# Patient Record
Sex: Female | Born: 1980 | Race: Black or African American | Hispanic: No | Marital: Single | Smoking: Never smoker
Health system: Southern US, Community
[De-identification: ages and names within clinical notes are randomized; demographics above are authoritative.]

## PROBLEM LIST (undated history)

## (undated) DIAGNOSIS — D219 Benign neoplasm of connective and other soft tissue, unspecified: Secondary | ICD-10-CM

## (undated) HISTORY — PX: BONE GRAFT: SHX377

## (undated) HISTORY — DX: Benign neoplasm of connective and other soft tissue, unspecified: D21.9

---

## 2021-04-28 ENCOUNTER — Emergency Department (HOSPITAL_COMMUNITY): Payer: Self-pay

## 2021-04-28 ENCOUNTER — Emergency Department (HOSPITAL_COMMUNITY)
Admission: EM | Admit: 2021-04-28 | Discharge: 2021-04-28 | Disposition: A | Discharge status: Home / Self Care | Payer: Self-pay | Attending: Emergency Medicine | Admitting: Emergency Medicine

## 2021-04-28 ENCOUNTER — Encounter (HOSPITAL_COMMUNITY): Payer: Self-pay | Admitting: Emergency Medicine

## 2021-04-28 ENCOUNTER — Other Ambulatory Visit: Payer: Self-pay

## 2021-04-28 DIAGNOSIS — R200 Anesthesia of skin: Secondary | ICD-10-CM | POA: Insufficient documentation

## 2021-04-28 DIAGNOSIS — M25512 Pain in left shoulder: Secondary | ICD-10-CM | POA: Insufficient documentation

## 2021-04-28 DIAGNOSIS — R110 Nausea: Secondary | ICD-10-CM | POA: Insufficient documentation

## 2021-04-28 DIAGNOSIS — Z5321 Procedure and treatment not carried out due to patient leaving prior to being seen by health care provider: Secondary | ICD-10-CM | POA: Insufficient documentation

## 2021-04-28 DIAGNOSIS — R1013 Epigastric pain: Secondary | ICD-10-CM | POA: Insufficient documentation

## 2021-04-28 LAB — CBC WITH DIFFERENTIAL/PLATELET
Abs Immature Granulocytes: 0.01 10*3/uL (ref 0.00–0.07)
Basophils Absolute: 0 10*3/uL (ref 0.0–0.1)
Basophils Relative: 0 %
Eosinophils Absolute: 0.1 10*3/uL (ref 0.0–0.5)
Eosinophils Relative: 1 %
HCT: 37.1 % (ref 36.0–46.0)
Hemoglobin: 12.1 g/dL (ref 12.0–15.0)
Immature Granulocytes: 0 %
Lymphocytes Relative: 45 %
Lymphs Abs: 3.1 10*3/uL (ref 0.7–4.0)
MCH: 28.7 pg (ref 26.0–34.0)
MCHC: 32.6 g/dL (ref 30.0–36.0)
MCV: 87.9 fL (ref 80.0–100.0)
Monocytes Absolute: 0.5 10*3/uL (ref 0.1–1.0)
Monocytes Relative: 7 %
Neutro Abs: 3.2 10*3/uL (ref 1.7–7.7)
Neutrophils Relative %: 47 %
Platelets: 235 10*3/uL (ref 150–400)
RBC: 4.22 MIL/uL (ref 3.87–5.11)
RDW: 13 % (ref 11.5–15.5)
WBC: 6.9 10*3/uL (ref 4.0–10.5)
nRBC: 0 % (ref 0.0–0.2)

## 2021-04-28 LAB — LIPASE, BLOOD: Lipase: 34 U/L (ref 11–51)

## 2021-04-28 LAB — COMPREHENSIVE METABOLIC PANEL
ALT: 9 U/L (ref 0–44)
AST: 13 U/L — ABNORMAL LOW (ref 15–41)
Albumin: 4 g/dL (ref 3.5–5.0)
Alkaline Phosphatase: 55 U/L (ref 38–126)
Anion gap: 10 (ref 5–15)
BUN: 10 mg/dL (ref 6–20)
CO2: 20 mmol/L — ABNORMAL LOW (ref 22–32)
Calcium: 9 mg/dL (ref 8.9–10.3)
Chloride: 111 mmol/L (ref 98–111)
Creatinine, Ser: 0.89 mg/dL (ref 0.44–1.00)
GFR, Estimated: >60 mL/min (ref >60)
Glucose, Bld: 100 mg/dL — ABNORMAL HIGH (ref 70–99)
Potassium: 3.8 mmol/L (ref 3.5–5.1)
Sodium: 141 mmol/L (ref 135–145)
Total Bilirubin: 0.5 mg/dL (ref 0.3–1.2)
Total Protein: 7 g/dL (ref 6.5–8.1)

## 2021-04-28 LAB — TROPONIN I (HIGH SENSITIVITY)
Troponin I (High Sensitivity): 2 ng/L (ref <18)
Troponin I (High Sensitivity): 3 ng/L (ref <18)

## 2021-04-28 MED ORDER — ONDANSETRON 4 MG PO TBDP
4.0000 mg | ORAL_TABLET | Freq: Once | ORAL | Status: AC
  Administered 2021-04-28: 4 mg via ORAL
  Filled 2021-04-28: qty 1

## 2021-04-28 MED ORDER — METHOCARBAMOL 500 MG PO TABS
500.0000 mg | ORAL_TABLET | Freq: Once | ORAL | Status: AC
Start: 2021-04-28 — End: 2021-04-28
  Administered 2021-04-28: 500 mg via ORAL
  Filled 2021-04-28: qty 1

## 2021-04-28 NOTE — ED Notes (Signed)
Pt says she leaving.

## 2021-04-28 NOTE — ED Triage Notes (Signed)
Patient reports left shoulder pain onset Thursday this week , denies injury , she adds " heartburn" yesterday , no emesis /respirations unlabored .

## 2021-04-28 NOTE — ED Provider Triage Note (Signed)
Emergency Medicine Provider Triage Evaluation Note  Michelle Smith , a 41 y.o. female  was evaluated in triage.  Pt complains of left shoulder pain and epigastric pain/heartburn.  Patient states she has had left shoulder pain for the past several days ever since she had about a 5-hour car ride from [place].  Today her pain worsened significantly.  She now has numbness rating into her biceps.  No weakness of her hand.  No neck pain.  No history of similar.  No trauma or injury.  Patient states today she developed upper abdominal burning, that she calls heartburn.  Associated nausea, no vomiting.  No history of similar.  She did have looser earlier, which he thought triggered it, but pain continues to worsen.  Makes it hard for her to take a deep breath in.  Review of Systems  Positive: L shoulder pain, epigastric burning Negative: Neck pain  Physical Exam  BP (!) 151/93 (BP Location: Right Arm)    Pulse 77    Temp 98.2 F (36.8 C) (Oral)    Resp 18    SpO2 100%  Gen:   Awake,  patient appears uncomfortable due to pain Resp:  Normal effort  MSK:   Moves extremities without difficulty.  Tenderness palpation of the left trapezius muscle.  No pain over midline C-spine.  No step-offs or deformities.  Grip strength equal bilaterally.  Radial pulses 2+ bilaterally. Other:  Tenderness palpation of epigastric abdomen.  Medical Decision Making  Medically screening exam initiated at 3:10 AM.  Appropriate orders placed.  Michelle Smith was informed that the remainder of the evaluation will be completed by another provider, this initial triage assessment does not replace that evaluation, and the importance of remaining in the ED until their evaluation is complete.  Labs, ekg, cxr, meds   Michelle Apley, PA-C 04/28/21 [id number]

## 2021-12-07 ENCOUNTER — Emergency Department
Admission: EM | Admit: 2021-12-07 | Discharge: 2021-12-07 | Disposition: A | Discharge status: Home / Self Care | Payer: BLUE CROSS/BLUE SHIELD | Attending: Emergency Medicine | Admitting: Emergency Medicine

## 2021-12-07 ENCOUNTER — Other Ambulatory Visit: Payer: Self-pay | Admitting: Family Medicine

## 2021-12-07 ENCOUNTER — Emergency Department: Payer: BLUE CROSS/BLUE SHIELD

## 2021-12-07 DIAGNOSIS — R102 Pelvic and perineal pain: Secondary | ICD-10-CM

## 2021-12-07 DIAGNOSIS — D250 Submucous leiomyoma of uterus: Secondary | ICD-10-CM | POA: Insufficient documentation

## 2021-12-07 DIAGNOSIS — D251 Intramural leiomyoma of uterus: Secondary | ICD-10-CM

## 2021-12-07 LAB — COMPREHENSIVE METABOLIC PANEL
ALT: 5 U/L (ref 0–55)
AST (SGOT): 12 U/L (ref 5–41)
Albumin/Globulin Ratio: 1.3 (ref 0.9–2.2)
Albumin: 4.2 g/dL (ref 3.5–5.0)
Alkaline Phosphatase: 69 U/L (ref 37–117)
Anion Gap: 8 (ref 5.0–15.0)
BUN: 11 mg/dL (ref 7.0–21.0)
Bilirubin, Total: 0.4 mg/dL (ref 0.2–1.2)
CO2: 24 mEq/L (ref 17–29)
Calcium: 9.1 mg/dL (ref 8.5–10.5)
Chloride: 106 mEq/L (ref 99–111)
Creatinine: 0.9 mg/dL (ref 0.4–1.0)
Globulin: 3.2 g/dL (ref 2.0–3.6)
Glucose: 104 mg/dL — ABNORMAL HIGH (ref 70–100)
Potassium: 3.9 mEq/L (ref 3.5–5.3)
Protein, Total: 7.4 g/dL (ref 6.0–8.3)
Sodium: 138 mEq/L (ref 135–145)
eGFR: >60 mL/min/{1.73_m2} (ref >=60)

## 2021-12-07 LAB — CBC AND DIFFERENTIAL
Absolute NRBC: 0 10*3/uL (ref 0.00–0.00)
Basophils Absolute Automated: 0.02 10*3/uL (ref 0.00–0.08)
Basophils Automated: 0.3 %
Eosinophils Absolute Automated: 0.17 10*3/uL (ref 0.00–0.44)
Eosinophils Automated: 2.4 %
Hematocrit: 37.1 % (ref 34.7–43.7)
Hgb: 12.3 g/dL (ref 11.4–14.8)
Immature Granulocytes Absolute: 0.01 10*3/uL (ref 0.00–0.07)
Immature Granulocytes: 0.1 %
Instrument Absolute Neutrophil Count: 3.71 10*3/uL (ref 1.10–6.33)
Lymphocytes Absolute Automated: 2.54 10*3/uL (ref 0.42–3.22)
Lymphocytes Automated: 36.5 %
MCH: 29.1 pg (ref 25.1–33.5)
MCHC: 33.2 g/dL (ref 31.5–35.8)
MCV: 87.7 fL (ref 78.0–96.0)
MPV: 10.4 fL (ref 8.9–12.5)
Monocytes Absolute Automated: 0.51 10*3/uL (ref 0.21–0.85)
Monocytes: 7.3 %
Neutrophils Absolute: 3.71 10*3/uL (ref 1.10–6.33)
Neutrophils: 53.4 %
Nucleated RBC: 0 /100 WBC (ref 0.0–0.0)
Platelets: 249 10*3/uL (ref 142–346)
RBC: 4.23 10*6/uL (ref 3.90–5.10)
RDW: 14 % (ref 11–15)
WBC: 6.96 10*3/uL (ref 3.10–9.50)

## 2021-12-07 LAB — URINALYSIS REFLEX TO MICROSCOPIC EXAM - REFLEX TO CULTURE
Bilirubin, UA: NEGATIVE
Blood, UA: NEGATIVE
Glucose, UA: NEGATIVE
Ketones UA: NEGATIVE
Leukocyte Esterase, UA: NEGATIVE
Nitrite, UA: NEGATIVE
Protein, UR: NEGATIVE
RBC, UA: NONE SEEN /hpf (ref 0–5)
Specific Gravity UA: 1.02 (ref 1.001–1.035)
Urine pH: 6 (ref 5.0–8.0)
Urobilinogen, UA: 0.2 mg/dL (ref 0.2–2.0)
WBC, UA: NONE SEEN /hpf (ref 0–5)

## 2021-12-07 LAB — URINE HCG QUALITATIVE: Urine HCG Qualitative: NEGATIVE

## 2021-12-07 MED ORDER — ACETAMINOPHEN 500 MG PO TABS
1000.0000 mg | ORAL_TABLET | Freq: Once | ORAL | Status: AC
Start: 2021-12-07 — End: 2021-12-07
  Administered 2021-12-07: 1000 mg via ORAL
  Filled 2021-12-07: qty 2

## 2021-12-07 NOTE — ED Provider Notes (Signed)
EMERGENCY DEPARTMENT NOTE     Patient initially seen and examined at   ED PHYSICIAN ASSIGNED       Date/Time Event User Comments    12/07/21 1617 Physician Assigned [facility], [place]. Ramiro Harvest, DO assigned as Attending           ED MIDLEVEL (APP) ASSIGNED       None            HISTORY OF PRESENT ILLNESS   {Translator Used (Optional):59393}    Chief Complaint: Pelvic Pain       41 y.o. female with past medical history who presents emergency department with pelvic pain.  The patient reports acute onset of symptoms this morning.  She reports pain is mainly in the suprapubic area and left lower abdomen.  Saw her primary care doctor who referred her to the ED for pelvic ultrasound.  Patient does have a history of uterine fibroids.  She denies any fevers, vaginal bleeding, nausea, vomiting, diarrhea, pain with urination, hematuria.  Last menstrual period was 1 week ago.    Independent Historian (other than patient): No  Additional History Provided by Independent Historian:  MEDICAL HISTORY     Past Medical History:  Past Medical History:   Diagnosis Date    Fibroids        Past Surgical History:  Past Surgical History:   Procedure Laterality Date    BONE GRAFT Right     thumb       Social History:  Social History     Socioeconomic History    Marital status: Single   Tobacco Use    Smoking status: Never     Passive exposure: Never    Smokeless tobacco: Never   Vaping Use    Vaping Use: Never used   Substance and Sexual Activity    Alcohol use: Yes    Drug use: Never       Family History:  No family history on file.    Outpatient Medication:  Previous Medications    No medications on file         REVIEW OF SYSTEMS   Review of Systems See History of Present Illness  PHYSICAL EXAM     ED Triage Vitals [12/07/21 1615]   Enc Vitals Group      BP 117/84      Heart Rate 79      Resp Rate 19      Temp 99.2 F (37.3 C)      Temp Source Oral      SpO2       Weight 76.2 kg      Height 1.727 m      Head Circumference       Peak Flow        Pain Score 7      Pain Loc       Pain Edu?       Excl. in [place]?      Physical Exam   CONSTITUTIONAL:  no acute distress, nontoxic appearing  EYES:  no scleral icterus. no conjunctival erythema  HEAD:  atraumatic, normocephalic  ENT:   no facial swelling, no nasal deformity. Airway patent.   NECK:  no swelling, trachea midline  CARDIOVASCULAR:  regular rate and rhythm  PULMONARY/CHEST: lungs clear, no respiratory distress  ABDOMEN:  soft, tenderness palpation in the left lower quadrant/left adnexa.  + Suprapubic tenderness.  GU: Deferrred  MUSCULOSKELETAL: normal ROM, no deformity  SKIN:  warm and  dry, no rash  NEURO: normal speech. no facial droop. moves all extremities.  PSYCH:  appropriate mood and behavior    MEDICAL DECISION MAKING     PRIMARY PROBLEM LIST      Chronic Illness with SEVERE Exacerbation/Progression DIAGNOSIS: left adnexal pain, uterine fibroids   {Chronic Illness Impacting Care of the above problem:59030} {Explain (Optional):59078}  {Differential Diagnosis:59053}  DISCUSSION      ***    {If patient is being hospitalized is severe sepsis or septic shock suspected?:59467}      {Was management discussed with a consultant?:59037}  {Was the decision around the need for surgery discussed with consultant:59056::"N/A"}  {External Records Reviewed?:59023}    Additional Notes    {Diagnostic test considered and not performed:59031::"N/A"}  {Prescription medications considered and not given:59033::"N/A"}  {Hospitalization considered but not done:59032::"N/A"}  {Social Determinants of Health Considerations:59036::"N/A"}  {Was there decision to not resuscitate or to de-escalate care due to poor prognosis?:59057}         Vital Signs: Reviewed the patient's vital signs.   Nursing Notes: Reviewed and utilized available nursing notes.  Medical Records Reviewed: Reviewed available past medical records.  Counseling: The emergency provider has spoken with the patient and discussed today's findings, in addition to  providing specific details for the plan of care.  Questions are answered and there is agreement with the plan.      MIPS DOCUMENTATION    {ORAL ANTIBIOTICS given for otitis externa due to:59079}  {ACUTE BRONCHITIS Medical reasoning for giving this patient oral antibiotics for acute bronchitis are the following (Optional):59080}  {Medical reasoning for giving this patient oral antibiotics for acute sinusitis are the following (Optional):59081}  {HEAD TRAUMA Indications for head CT due to trauma (Optional):59082}    CARDIAC STUDIES    The following cardiac studies were independently interpreted by me the Emergency Medicine Provider.  For full cardiac study results please see chart.    {Monitor Strip Interpretation:59688}  [id number]  {Rhythm:59687}  {ST segments:59689}    {EKG interpretation:59684}  {Comparison:59859}  {Rate:59685}  {Rhythm:59687}  {ST segments:59689}  {EKG interpretation:59690}    {EKG interpretation:59684}  {Comparison:59859}  {Rate:59685}  {Rhythm:59687}  {ST segments:59689}  {EKG interpretation:59690}  EMERGENCY IMAGING STUDIES    The following imagine studies were independently interpreted by me (emergency medicine provider):    {Xray interpreted by ED provider? (Optional):59468} {SIDE (Optional):59475}  {Comparison:59859}  {RESULT:59469}  {IMPRESSION:59470}    {CT interpreted by provider? (Optional):59471}  {Comparison:59859}  {RESULT:59473}  {IMPRESSION:59474}  RADIOLOGY IMAGING STUDIES      No orders to display       EMERGENCY DEPT. MEDICATIONS      ED Medication Orders (From admission, onward)      None            LABORATORY RESULTS    Ordered and independently interpreted AVAILABLE laboratory tests.   Results       ** No results found for the last 24 hours. **              CRITICAL CARE/PROCEDURES    Procedures  ***Critical care?  DIAGNOSIS      Diagnosis:  Final diagnoses:   None       Disposition:  ED Disposition       None            Prescriptions:  Patient's Medications    No  medications on file           This note was generated by the Epic EMR system/ Dragon  speech recognition and may contain inherent errors or omissions not intended by the user. Grammatical errors, random word insertions, deletions and pronoun errors  are occasional consequences of this technology due to software limitations. Not all errors are caught or corrected. If there are questions or concerns about the content of this note or information contained within the body of this dictation they should be addressed directly with the author for clarification.    {THIS REVIEW SECTION WILL AUTODELETE ONCE NOTE IS SIGNED    END REVIEW SECTION(Optional):55325}

## 2021-12-07 NOTE — Discharge Instructions (Signed)
Take Tylenol or Motrin as needed for pain.  Follow-up with your primary care doctor and OB/GYN as discussed.  Return to the ED for continued or worsening symptoms, fevers, excessive vomiting, vaginal bleeding through more than 2 pads an hour, or other worrisome symptoms.

## 2023-09-12 IMAGING — CR DG CHEST 2V
2 series · 2 of 2 positions shown · non-contrast
Comparison: None.

CLINICAL DATA: Chest pain

EXAM:
CHEST - 2 VIEW

[chest pa]
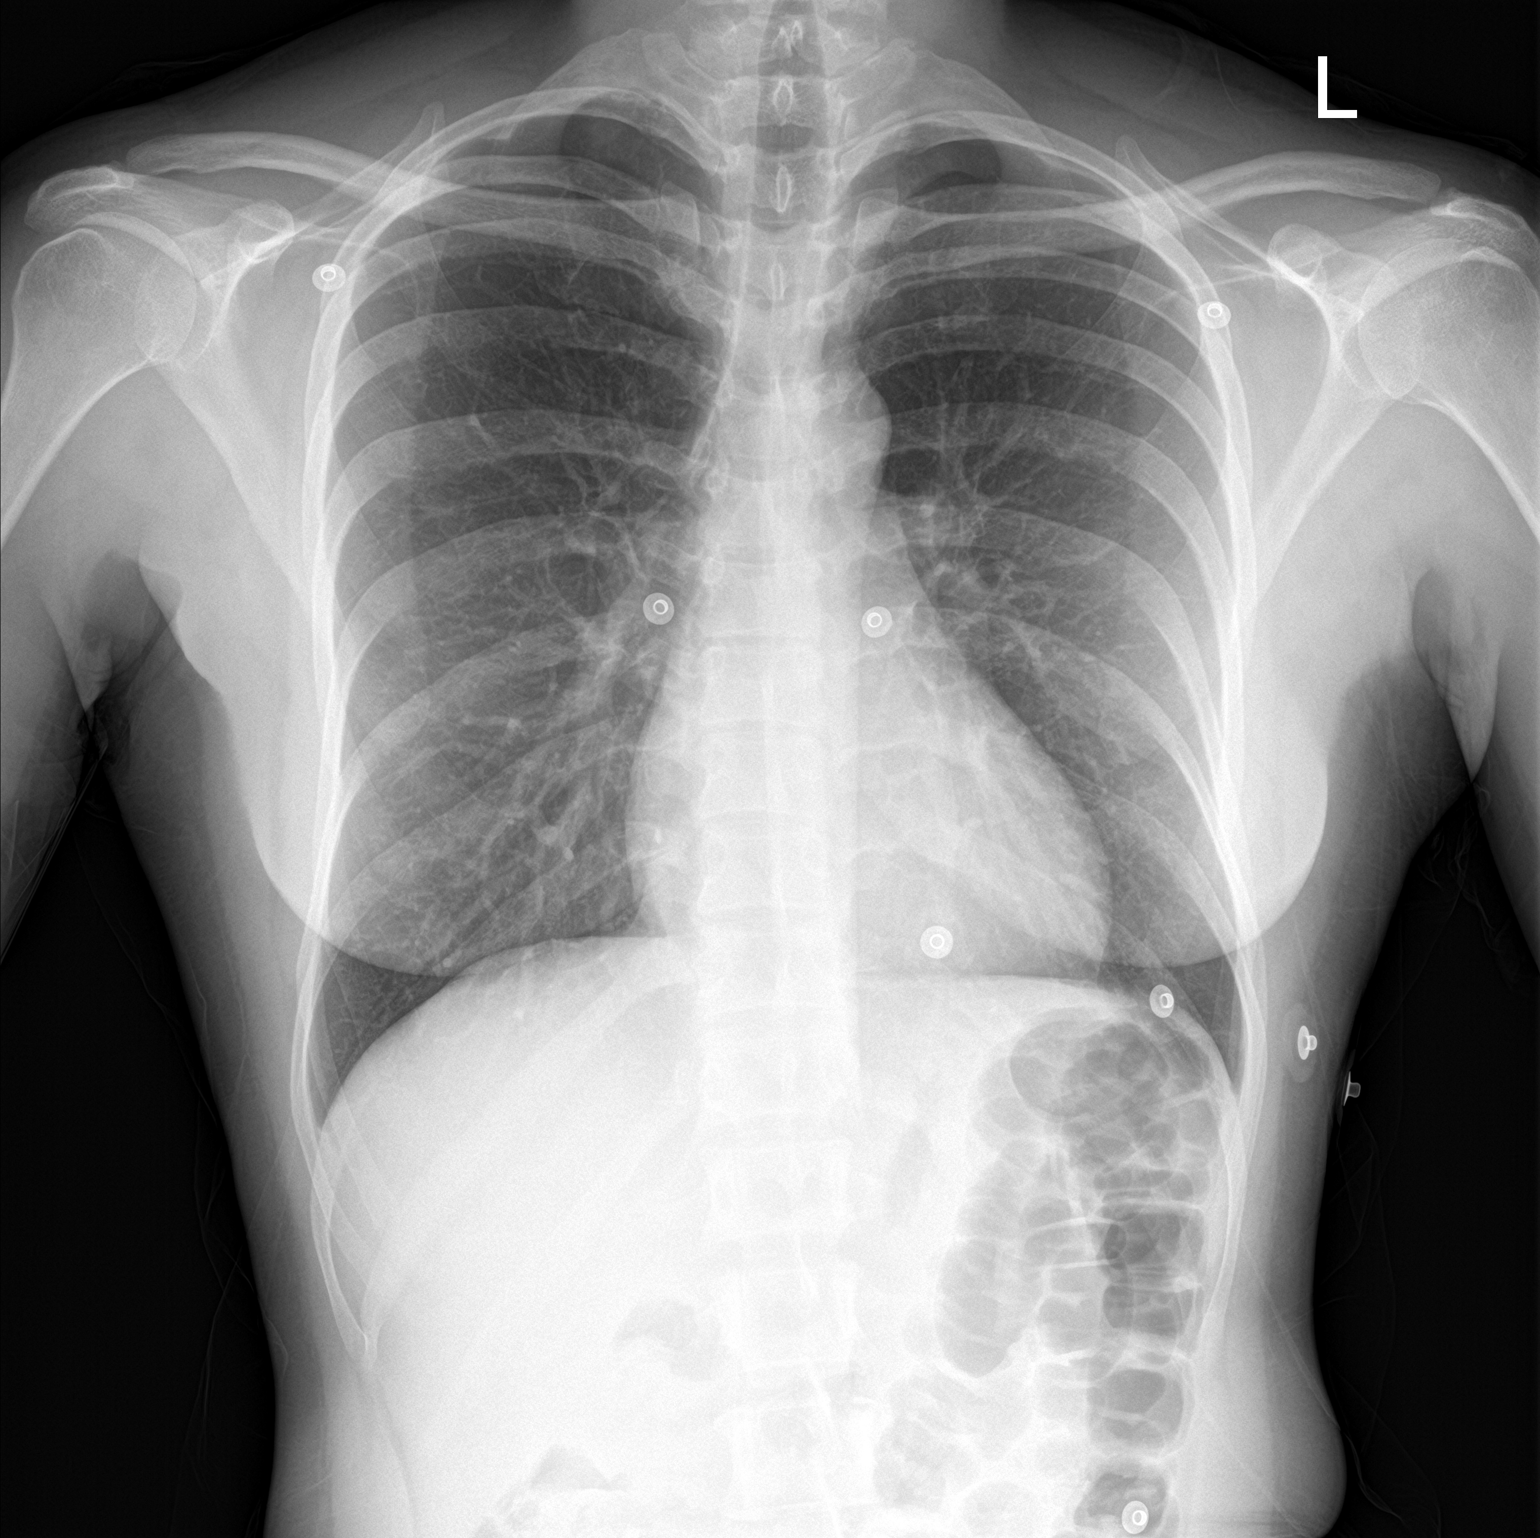

[chest lat]
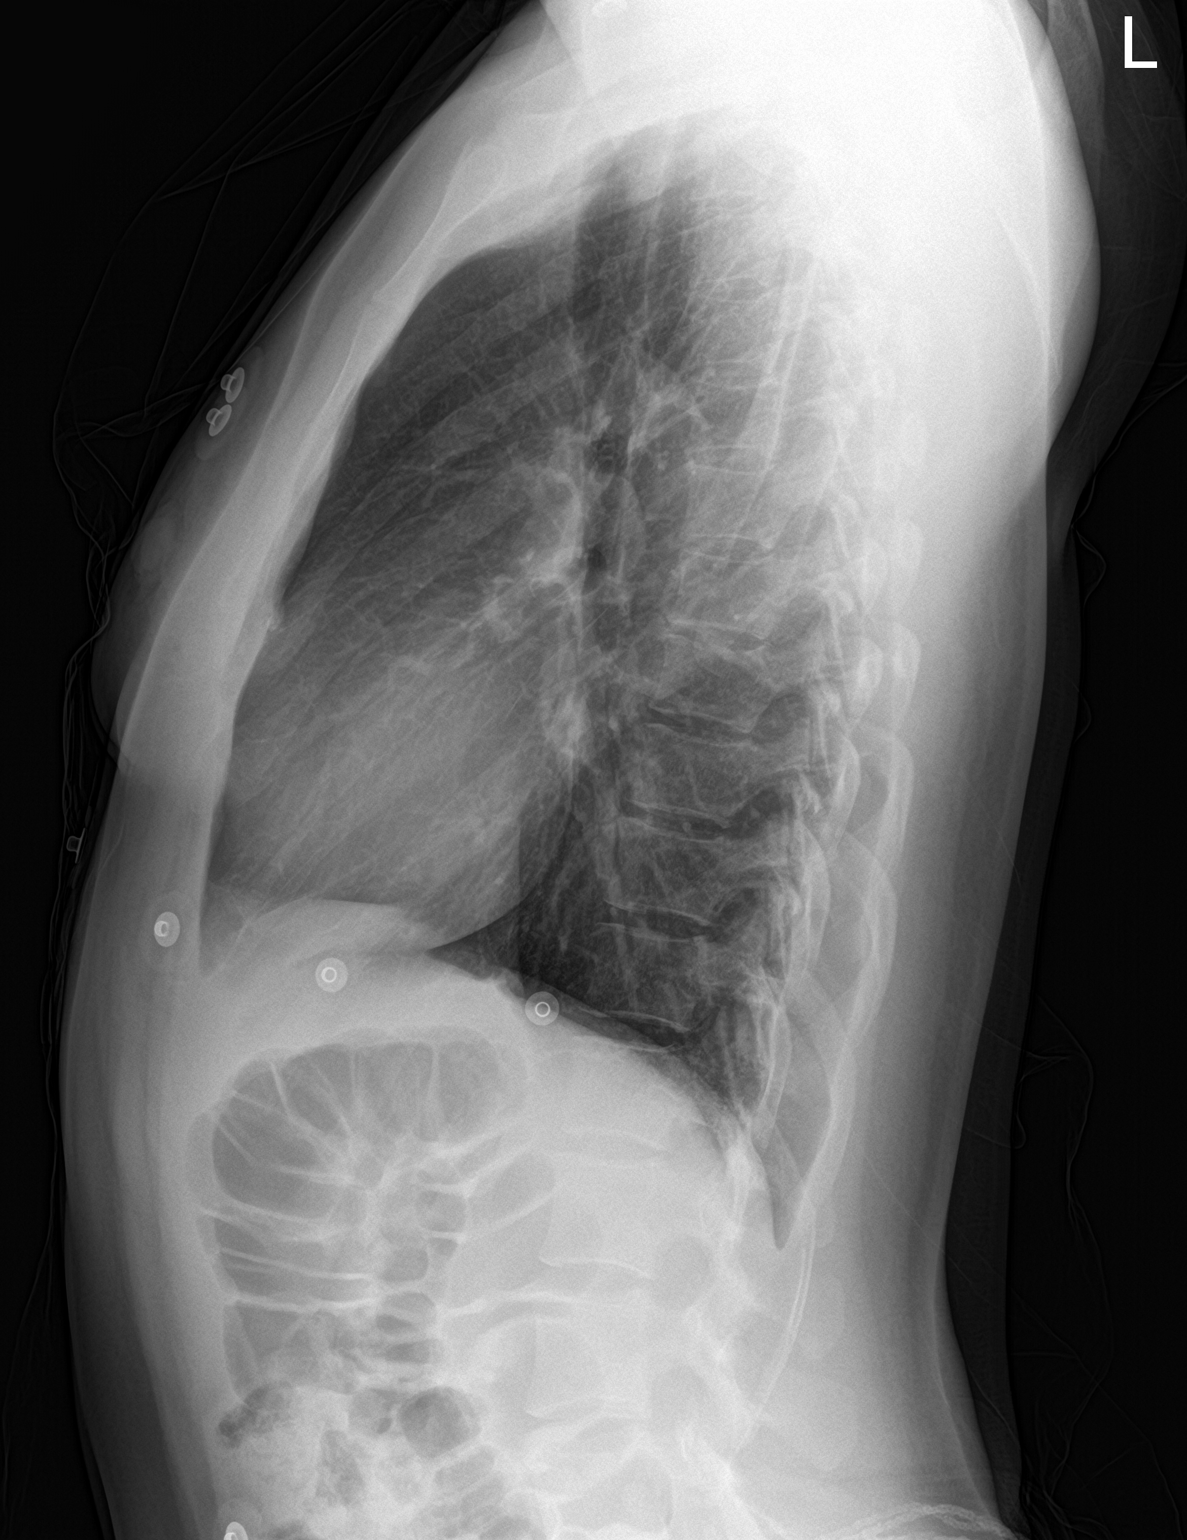

[2 of 2 positions shown; findings below may reference images not displayed]

FINDINGS: Normal heart size and mediastinal contours. No acute infiltrate or
edema. No effusion or pneumothorax. No acute osseous findings.
IMPRESSION: Negative chest.
